# Patient Record
Sex: Male | Born: 1980 | Race: White | Hispanic: No | Marital: Single | Smoking: Former smoker
Health system: Southern US, Community
[De-identification: ages and names within clinical notes are randomized; demographics above are authoritative.]

## PROBLEM LIST (undated history)

## (undated) DIAGNOSIS — K219 Gastro-esophageal reflux disease without esophagitis: Secondary | ICD-10-CM

## (undated) DIAGNOSIS — K429 Umbilical hernia without obstruction or gangrene: Secondary | ICD-10-CM

## (undated) DIAGNOSIS — R011 Cardiac murmur, unspecified: Secondary | ICD-10-CM

## (undated) HISTORY — PX: NASAL SEPTUM SURGERY: SHX37

## (undated) HISTORY — PX: KNEE SURGERY: SHX244

## (undated) HISTORY — PX: SHOULDER ARTHROSCOPY: SHX128

---

## 2004-08-07 ENCOUNTER — Ambulatory Visit: Payer: Self-pay | Admitting: Otolaryngology

## 2004-12-20 ENCOUNTER — Emergency Department: Payer: Self-pay | Admitting: General Practice

## 2007-02-13 ENCOUNTER — Emergency Department: Payer: Self-pay | Admitting: Unknown Physician Specialty

## 2007-03-02 ENCOUNTER — Ambulatory Visit: Payer: Self-pay | Admitting: Sports Medicine

## 2007-03-10 ENCOUNTER — Ambulatory Visit: Payer: Self-pay | Admitting: Orthopaedic Surgery

## 2020-09-22 ENCOUNTER — Other Ambulatory Visit: Payer: Self-pay | Admitting: Otolaryngology

## 2020-09-22 DIAGNOSIS — R42 Dizziness and giddiness: Secondary | ICD-10-CM

## 2020-09-29 ENCOUNTER — Other Ambulatory Visit: Payer: Self-pay

## 2020-09-29 ENCOUNTER — Ambulatory Visit
Admission: RE | Admit: 2020-09-29 | Discharge: 2020-09-29 | Disposition: A | Discharge status: Home / Self Care | Payer: BC Managed Care – PPO | Source: Ambulatory Visit | Attending: Otolaryngology | Admitting: Otolaryngology

## 2020-09-29 DIAGNOSIS — R42 Dizziness and giddiness: Secondary | ICD-10-CM | POA: Insufficient documentation

## 2020-09-29 MED ORDER — GADOBUTROL 1 MMOL/ML IV SOLN
10.0000 mL | Freq: Once | INTRAVENOUS | Status: AC | PRN
  Administered 2020-09-29: 10 mL via INTRAVENOUS

## 2021-09-13 ENCOUNTER — Ambulatory Visit: Payer: Self-pay | Admitting: General Surgery

## 2021-09-18 ENCOUNTER — Ambulatory Visit: Payer: Self-pay | Admitting: General Surgery

## 2021-09-18 NOTE — H&P (View-Only) (Signed)
PATIENT PROFILE: ?Wayne Mcknight is a 41 y.o. male who presents to the Clinic for consultation at the request of Dr. Hazle Quant for evaluation of umbilical hernia. ? ?PCP:  Garry Heater, MD ? ?HISTORY OF PRESENT ILLNESS: ?Wayne Mcknight reports continued having pain in the periumbilical area.  Pain localized to the periumbilical area.  There is no palliation.  Pain is aggravated by coughing or straining.  There is no alleviating factors.  Patient cannot completely reduce the hernia.  Tender to palpation upon trying to reduce the hernia.  Denies any episode of abdominal distention nausea or vomiting. ? ? ?PROBLEM LIST: ?Problem List  Date Reviewed: 07/18/2021  ? ?       Noted  ? GERD (gastroesophageal reflux disease) Unknown  ? Anxiety Unknown  ? Insomnia Unknown  ? Overview  ?  Due to 3rd shift work ?  ?  ? ? ?GENERAL REVIEW OF SYSTEMS:  ? ?General ROS: negative for - chills, fatigue, fever, weight gain or weight loss ?Allergy and Immunology ROS: negative for - hives  ?Hematological and Lymphatic ROS: negative for - bleeding problems or bruising, negative for palpable nodes ?Endocrine ROS: negative for - heat or cold intolerance, hair changes ?Respiratory ROS: negative for - cough, shortness of breath or wheezing ?Cardiovascular ROS: no chest pain or palpitations ?GI ROS: negative for nausea, vomiting, positive for abdominal pain  ?Musculoskeletal ROS: negative for - joint swelling or muscle pain ?Neurological ROS: negative for - confusion, syncope ?Dermatological ROS: negative for pruritus and rash ?Psychiatric: negative for anxiety, depression, difficulty sleeping and memory loss ? ?MEDICATIONS: ?Current Outpatient Medications  ?Medication Sig Dispense Refill  ? azelastine (ASTELIN) 137 mcg nasal spray PLACE 1-2 SPRAYS INTO EACH NOSTRIL EVERY 12 HOURS AS NEEDED FOR DRAINAGE    ? cetirizine (ZYRTEC) 10 MG tablet Take 10 mg by mouth once daily as needed    ? esomeprazole (NEXIUM) 20 MG DR capsule Take 20  mg by mouth once daily.    ? famotidine (PEPCID) 10 MG tablet Take 10 mg by mouth once daily    ? ?No current facility-administered medications for this visit.  ? ? ?ALLERGIES: ?Amoxicillin (bulk) ? ?PAST MEDICAL HISTORY: ?Past Medical History:  ?Diagnosis Date  ? Allergic rhinitis   ? Anxiety   ? Arrhythmia Functional heart murmor diagnosed at birth  ? GERD (gastroesophageal reflux disease)   ? Heart murmur, unspecified Diagnosed at birth  ? Insomnia   ? Due to 3rd shift work  ? ? ?PAST SURGICAL HISTORY: ?Past Surgical History:  ?Procedure Laterality Date  ? KNEE ARTHROSCOPY  2000  ? SHOULDER ARTHROSCOPY DISTAL CLAVICLE EXCISION AND OPEN ROTATOR CUFF REPAIR Right 2008  ? Labral repair, biceps tendonosis - [facility]  ? TONSILLECTOMY    ?  ? ?FAMILY HISTORY: ?Family History  ?Problem Relation Age of Onset  ? Stroke Paternal Grandmother   ? Stroke Paternal Grandfather   ?  ? ?SOCIAL HISTORY: ?Social History  ? ?Socioeconomic History  ? Marital status: Single  ?  Spouse name: Reuel Boom  ? Number of children: 1  ?Occupational History  ? Occupation: Pensions consultant  ?  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 Smoking status: Former  ?  Packs/day: 0.00  ?  Years: 0.00  ?  Pack years: 0.00  ?  Types: Cigarettes  ? Smokeless tobacco: Former  ?Substance and Sexual Activity  ? Alcohol use: Yes  ?  Alcohol/week: 0.0 standard drinks  ?  Comment: Few drinks/wk  ? Drug  use: No  ? Sexual activity: Yes  ?  Partners: Female  ? ? ?PHYSICAL EXAM: ?Vitals:  ? 09/18/21 0825  ?BP: 139/62  ?Pulse: 79  ? ?Body mass index is 31.66 kg/m?. ?Weight: (!) 108.9 kg (240 lb)  ? ?GENERAL: Alert, active, oriented x3 ? ?HEENT: Pupils equal reactive to light. Extraocular movements are intact. Sclera clear. Palpebral conjunctiva normal red color.Pharynx clear. ? ?NECK: Supple with no palpable mass and no adenopathy. ? ?LUNGS: Sound clear with no rales rhonchi or wheezes. ? ?HEART: Regular rhythm S1 and S2 without murmur. ? ?ABDOMEN: Soft and depressible,  nontender with no palpable mass, no hepatomegaly.  Palpable umbilical hernia, tender to palpation, no skin changes.  Unable to completely reduce. ? ?EXTREMITIES: Well-developed well-nourished symmetrical with no dependent edema. ? ?NEUROLOGICAL: Awake alert oriented, facial expression symmetrical, moving all extremities. ? ?REVIEW OF DATA: ?I have reviewed the following data today: ?No visits with results within 3 Month(s) from this visit.  ?Latest known visit with results is:  ?Nurse Only on 01/17/2020  ?Component Date Value  ? Coronavirus (COVID-19) S* 01/17/2020 Not Detected   ?  ? ?ASSESSMENT: ?Wayne Mcknight is a 41 y.o. male presenting for reevaluation for umbilical hernia repair.   ? ?The patient continue with a symptomatic umbilical incarcerated hernia. Patient was oriented about the diagnosis of umbilical hernia and its implication. The patient was oriented about the treatment alternatives (observation vs surgical repair). Due to patient symptoms, repair is recommended. Patient oriented about the surgical procedure, the use of mesh and its risk of complications such as: infection, bleeding, injury to vasculature, injury to bowel or bladder, and chronic pain, intestinal obstruction, among others.  ? ?Umbilical hernia without obstruction and without gangrene [K42.9] ? ?PLAN: ?1.  Robotic assisted laparoscopic umbilical hernia repair with mesh (47340) ?2.  CBC, CMP ?3.  Avoid taking aspirin 5 days before procedure ?4.  Contact us if has any question or concern.  ? ?Patient verbalized understanding, all questions were answered, and were agreeable with the plan outlined above.  ? ? ?Carolan Shiver, MD ? ?Electronically signed by Carolan Shiver, MD ? ?

## 2021-09-18 NOTE — H&P (Signed)
PATIENT PROFILE: ?Wayne Mcknight is a 41 y.o. male who presents to the Clinic for consultation at the request of Dr. Hazle Quant for evaluation of umbilical hernia. ? ?PCP:  Garry Heater, MD ? ?HISTORY OF PRESENT ILLNESS: ?Wayne Mcknight reports continued having pain in the periumbilical area.  Pain localized to the periumbilical area.  There is no palliation.  Pain is aggravated by coughing or straining.  There is no alleviating factors.  Patient cannot completely reduce the hernia.  Tender to palpation upon trying to reduce the hernia.  Denies any episode of abdominal distention nausea or vomiting. ? ? ?PROBLEM LIST: ?Problem List  Date Reviewed: 07/18/2021  ? ?       Noted  ? GERD (gastroesophageal reflux disease) Unknown  ? Anxiety Unknown  ? Insomnia Unknown  ? Overview  ?  Due to 3rd shift work ?  ?  ? ? ?GENERAL REVIEW OF SYSTEMS:  ? ?General ROS: negative for - chills, fatigue, fever, weight gain or weight loss ?Allergy and Immunology ROS: negative for - hives  ?Hematological and Lymphatic ROS: negative for - bleeding problems or bruising, negative for palpable nodes ?Endocrine ROS: negative for - heat or cold intolerance, hair changes ?Respiratory ROS: negative for - cough, shortness of breath or wheezing ?Cardiovascular ROS: no chest pain or palpitations ?GI ROS: negative for nausea, vomiting, positive for abdominal pain  ?Musculoskeletal ROS: negative for - joint swelling or muscle pain ?Neurological ROS: negative for - confusion, syncope ?Dermatological ROS: negative for pruritus and rash ?Psychiatric: negative for anxiety, depression, difficulty sleeping and memory loss ? ?MEDICATIONS: ?Current Outpatient Medications  ?Medication Sig Dispense Refill  ? azelastine (ASTELIN) 137 mcg nasal spray PLACE 1-2 SPRAYS INTO EACH NOSTRIL EVERY 12 HOURS AS NEEDED FOR DRAINAGE    ? cetirizine (ZYRTEC) 10 MG tablet Take 10 mg by mouth once daily as needed    ? esomeprazole (NEXIUM) 20 MG DR capsule Take 20  mg by mouth once daily.    ? famotidine (PEPCID) 10 MG tablet Take 10 mg by mouth once daily    ? ?No current facility-administered medications for this visit.  ? ? ?ALLERGIES: ?Amoxicillin (bulk) ? ?PAST MEDICAL HISTORY: ?Past Medical History:  ?Diagnosis Date  ? Allergic rhinitis   ? Anxiety   ? Arrhythmia Functional heart murmor diagnosed at birth  ? GERD (gastroesophageal reflux disease)   ? Heart murmur, unspecified Diagnosed at birth  ? Insomnia   ? Due to 3rd shift work  ? ? ?PAST SURGICAL HISTORY: ?Past Surgical History:  ?Procedure Laterality Date  ? KNEE ARTHROSCOPY  2000  ? SHOULDER ARTHROSCOPY DISTAL CLAVICLE EXCISION AND OPEN ROTATOR CUFF REPAIR Right 2008  ? Labral repair, biceps tendonosis - [facility]  ? TONSILLECTOMY    ?  ? ?FAMILY HISTORY: ?Family History  ?Problem Relation Age of Onset  ? Stroke Paternal Grandmother   ? Stroke Paternal Grandfather   ?  ? ?SOCIAL HISTORY: ?Social History  ? ?Socioeconomic History  ? Marital status: Single  ?  Spouse name: Reuel Boom  ? Number of children: 1  ?Occupational History  ? Occupation: Pensions consultant  ?  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 Smoking status: Former  ?  Packs/day: 0.00  ?  Years: 0.00  ?  Pack years: 0.00  ?  Types: Cigarettes  ? Smokeless tobacco: Former  ?Substance and Sexual Activity  ? Alcohol use: Yes  ?  Alcohol/week: 0.0 standard drinks  ?  Comment: Few drinks/wk  ? Drug  use: No  ? Sexual activity: Yes  ?  Partners: Female  ? ? ?PHYSICAL EXAM: ?Vitals:  ? 09/18/21 0825  ?BP: 139/62  ?Pulse: 79  ? ?Body mass index is 31.66 kg/m?. ?Weight: (!) 108.9 kg (240 lb)  ? ?GENERAL: Alert, active, oriented x3 ? ?HEENT: Pupils equal reactive to light. Extraocular movements are intact. Sclera clear. Palpebral conjunctiva normal red color.Pharynx clear. ? ?NECK: Supple with no palpable mass and no adenopathy. ? ?LUNGS: Sound clear with no rales rhonchi or wheezes. ? ?HEART: Regular rhythm S1 and S2 without murmur. ? ?ABDOMEN: Soft and depressible,  nontender with no palpable mass, no hepatomegaly.  Palpable umbilical hernia, tender to palpation, no skin changes.  Unable to completely reduce. ? ?EXTREMITIES: Well-developed well-nourished symmetrical with no dependent edema. ? ?NEUROLOGICAL: Awake alert oriented, facial expression symmetrical, moving all extremities. ? ?REVIEW OF DATA: ?I have reviewed the following data today: ?No visits with results within 3 Month(s) from this visit.  ?Latest known visit with results is:  ?Nurse Only on 01/17/2020  ?Component Date Value  ? Coronavirus (COVID-19) S* 01/17/2020 Not Detected   ?  ? ?ASSESSMENT: ?Wayne Mcknight is a 41 y.o. male presenting for reevaluation for umbilical hernia repair.   ? ?The patient continue with a symptomatic umbilical incarcerated hernia. Patient was oriented about the diagnosis of umbilical hernia and its implication. The patient was oriented about the treatment alternatives (observation vs surgical repair). Due to patient symptoms, repair is recommended. Patient oriented about the surgical procedure, the use of mesh and its risk of complications such as: infection, bleeding, injury to vasculature, injury to bowel or bladder, and chronic pain, intestinal obstruction, among others.  ? ?Umbilical hernia without obstruction and without gangrene [K42.9] ? ?PLAN: ?1.  Robotic assisted laparoscopic umbilical hernia repair with mesh (47340) ?2.  CBC, CMP ?3.  Avoid taking aspirin 5 days before procedure ?4.  Contact us if has any question or concern.  ? ?Patient verbalized understanding, all questions were answered, and were agreeable with the plan outlined above.  ? ? ?Carolan Shiver, MD ? ?Electronically signed by Carolan Shiver, MD ? ?

## 2021-09-20 ENCOUNTER — Ambulatory Visit: Payer: Self-pay | Admitting: General Surgery

## 2021-09-24 ENCOUNTER — Encounter
Admission: RE | Admit: 2021-09-24 | Discharge: 2021-09-24 | Disposition: A | Discharge status: Home / Self Care | Payer: BC Managed Care – PPO | Source: Ambulatory Visit | Attending: General Surgery | Admitting: General Surgery

## 2021-09-24 HISTORY — DX: Umbilical hernia without obstruction or gangrene: K42.9

## 2021-09-24 HISTORY — DX: Cardiac murmur, unspecified: R01.1

## 2021-09-24 HISTORY — DX: Gastro-esophageal reflux disease without esophagitis: K21.9

## 2021-09-24 NOTE — Patient Instructions (Signed)
Your procedure is scheduled on:10-01-21 Monday ?Report to the Registration Desk on the 1st floor of the [facility].Then proceed to the 2nd floor Surgery Desk ?To find out your arrival time, please call [phone] between 1PM - 3PM on:09-28-21 Friday ?If your arrival time is 6:00 am, do not arrive prior to that time as the [facility] entrance doors do not open until 6:00 am. ? ?REMEMBER: ?Instructions that are not followed completely may result in serious medical risk, up to and including death; or upon the discretion of your surgeon and anesthesiologist your surgery may need to be rescheduled. ? ?Do not eat food OR drink any liquids after midnight the night before surgery.  ?No gum chewing, lozengers or hard candies.. ? ?TAKE THESE MEDICATIONS THE MORNING OF SURGERY WITH A SIP OF WATER: ?-esomeprazole (NEXIUM)- take one the night before and one on the morning of surgery - helps to prevent nausea after surgery.) ? ?One week prior to surgery: ?Stop Anti-inflammatories (NSAIDS) such as Advil, Aleve, Ibuprofen, Motrin, Naproxen, Naprosyn and Aspirin based products such as Excedrin, Goodys Powder, BC Powder.You may however, take Tylenol if needed for pain up until the day of surgery. ?Stop ANY OVER THE COUNTER supplements until after surgery (Vitamin C, Collagen and Protein Powder) ? ?No Alcohol for 24 hours before or after surgery. ? ?No Smoking including e-cigarettes for 24 hours prior to surgery.  ?No chewable tobacco products for at least 6 hours prior to surgery.  ?No nicotine patches on the day of surgery. ? ?Do not use any "recreational" drugs for at least a week prior to your surgery.  ?Please be advised that the combination of cocaine and anesthesia may have negative outcomes, up to and including death. ?If you test positive for cocaine, your surgery will be cancelled. ? ?On the morning of surgery brush your teeth with toothpaste and water, you may rinse your mouth with mouthwash if you wish. ?Do not swallow  any toothpaste or mouthwash. ? ?Use CHG Soap as directed on instruction sheet. ? ?Do not wear jewelry, make-up, hairpins, clips or nail polish. ? ?Do not wear lotions, powders, or perfumes.  ? ?Do not shave body from the neck down 48 hours prior to surgery just in case you cut yourself which could leave a site for infection.  ?Also, freshly shaved skin may become irritated if using the CHG soap. ? ?Contact lenses, hearing aids and dentures may not be worn into surgery. ? ?Do not bring valuables to the hospital. [facility] is not responsible for any missing/lost belongings or valuables. ? ?Notify your doctor if there is any change in your medical condition (cold, fever, infection). ? ?Wear comfortable clothing (specific to your surgery type) to the hospital. ? ?After surgery, you can help prevent lung complications by doing breathing exercises.  ?Take deep breaths and cough every 1-2 hours. Your doctor may order a device called an Incentive Spirometer to help you take deep breaths. ?When coughing or sneezing, hold a pillow firmly against your incision with both hands. This is called ?splinting.? Doing this helps protect your incision. It also decreases belly discomfort. ? ?If you are being admitted to the hospital overnight, leave your suitcase in the car. ?After surgery it may be brought to your room. ? ?If you are being discharged the day of surgery, you will not be allowed to drive home. ?You will need a responsible adult (18 years or older) to drive you home and stay with you that night.  ? ?If  you are taking public transportation, you will need to have a responsible adult (18 years or older) with you. ?Please confirm with your physician that it is acceptable to use public transportation.  ? ?Please call the Pre-admissions Testing Dept. at [phone] if you have any questions about these instructions. ? ?Surgery Visitation Policy: ? ?Patients undergoing a surgery or procedure may have two family members or  support persons with them as long as the person is not COVID-19 positive or experiencing its symptoms.  ?

## 2021-09-28 ENCOUNTER — Inpatient Hospital Stay: Admission: RE | Admit: 2021-09-28 | Payer: BC Managed Care – PPO | Source: Ambulatory Visit

## 2021-10-01 ENCOUNTER — Other Ambulatory Visit: Payer: Self-pay

## 2021-10-01 ENCOUNTER — Encounter
Admission: RE | Disposition: A | Discharge status: Home / Self Care | Payer: Self-pay | Source: Home / Self Care | Attending: General Surgery

## 2021-10-01 ENCOUNTER — Ambulatory Visit: Payer: BC Managed Care – PPO | Admitting: Anesthesiology

## 2021-10-01 ENCOUNTER — Ambulatory Visit
Admission: RE | Admit: 2021-10-01 | Discharge: 2021-10-01 | Disposition: A | Discharge status: Home / Self Care | Payer: BC Managed Care – PPO | Attending: General Surgery | Admitting: General Surgery

## 2021-10-01 DIAGNOSIS — K429 Umbilical hernia without obstruction or gangrene: Secondary | ICD-10-CM | POA: Diagnosis present

## 2021-10-01 DIAGNOSIS — K420 Umbilical hernia with obstruction, without gangrene: Secondary | ICD-10-CM | POA: Diagnosis not present

## 2021-10-01 HISTORY — PX: INSERTION OF MESH: SHX5868

## 2021-10-01 SURGERY — REPAIR, HERNIA, UMBILICAL, ROBOT-ASSISTED
Anesthesia: General | Site: Abdomen

## 2021-10-01 MED ORDER — SUCCINYLCHOLINE CHLORIDE 200 MG/10ML IV SOSY
PREFILLED_SYRINGE | INTRAVENOUS | Status: DC | PRN
  Administered 2021-10-01: 140 mg via INTRAVENOUS

## 2021-10-01 MED ORDER — DEXAMETHASONE SODIUM PHOSPHATE 10 MG/ML IJ SOLN
INTRAMUSCULAR | Status: DC | PRN
Start: 2021-10-01 — End: 2021-10-01
  Administered 2021-10-01: 10 mg via INTRAVENOUS

## 2021-10-01 MED ORDER — MIDAZOLAM HCL 2 MG/2ML IJ SOLN
INTRAMUSCULAR | Status: AC
  Filled 2021-10-01: qty 2

## 2021-10-01 MED ORDER — PROPOFOL 10 MG/ML IV BOLUS
INTRAVENOUS | Status: AC
  Filled 2021-10-01: qty 20

## 2021-10-01 MED ORDER — ACETAMINOPHEN 10 MG/ML IV SOLN
INTRAVENOUS | Status: DC | PRN
  Administered 2021-10-01: 1000 mg via INTRAVENOUS

## 2021-10-01 MED ORDER — FENTANYL CITRATE (PF) 100 MCG/2ML IJ SOLN
INTRAMUSCULAR | Status: DC | PRN
  Administered 2021-10-01 (×2): 50 ug via INTRAVENOUS

## 2021-10-01 MED ORDER — PHENYLEPHRINE HCL (PRESSORS) 10 MG/ML IV SOLN
INTRAVENOUS | Status: DC | PRN
  Administered 2021-10-01 (×3): 80 ug via INTRAVENOUS

## 2021-10-01 MED ORDER — OXYCODONE-ACETAMINOPHEN 5-325 MG PO TABS: 1.0000 | ORAL_TABLET | ORAL | 0 refills | Status: AC | PRN

## 2021-10-01 MED ORDER — ONDANSETRON HCL 4 MG/2ML IJ SOLN: 4.0000 mg | Freq: Once | INTRAMUSCULAR | Status: DC | PRN

## 2021-10-01 MED ORDER — KETOROLAC TROMETHAMINE 30 MG/ML IJ SOLN
INTRAMUSCULAR | Status: DC | PRN
  Administered 2021-10-01: 15 mg via INTRAVENOUS

## 2021-10-01 MED ORDER — ONDANSETRON HCL 4 MG/2ML IJ SOLN
INTRAMUSCULAR | Status: DC | PRN
  Administered 2021-10-01: 4 mg via INTRAVENOUS

## 2021-10-01 MED ORDER — SEVOFLURANE IN SOLN
RESPIRATORY_TRACT | Status: AC
  Filled 2021-10-01: qty 250

## 2021-10-01 MED ORDER — CHLORHEXIDINE GLUCONATE 0.12 % MT SOLN
OROMUCOSAL | Status: AC
  Administered 2021-10-01: 15 mL via OROMUCOSAL
  Filled 2021-10-01: qty 15

## 2021-10-01 MED ORDER — ORAL CARE MOUTH RINSE
15.0000 mL | Freq: Once | OROMUCOSAL | Status: AC
Start: 2021-10-01 — End: 2021-10-01

## 2021-10-01 MED ORDER — KETAMINE HCL 10 MG/ML IJ SOLN
INTRAMUSCULAR | Status: DC | PRN
  Administered 2021-10-01 (×2): 20 mg via INTRAVENOUS

## 2021-10-01 MED ORDER — FENTANYL CITRATE (PF) 100 MCG/2ML IJ SOLN
INTRAMUSCULAR | Status: AC
  Filled 2021-10-01: qty 2

## 2021-10-01 MED ORDER — DEXMEDETOMIDINE HCL IN NACL 200 MCG/50ML IV SOLN
INTRAVENOUS | Status: DC | PRN
  Administered 2021-10-01: 12 ug via INTRAVENOUS
  Administered 2021-10-01: 8 ug via INTRAVENOUS

## 2021-10-01 MED ORDER — CEFAZOLIN SODIUM-DEXTROSE 2-4 GM/100ML-% IV SOLN
2.0000 g | INTRAVENOUS | Status: AC
  Administered 2021-10-01: 2 g via INTRAVENOUS

## 2021-10-01 MED ORDER — GLYCOPYRROLATE 0.2 MG/ML IJ SOLN
INTRAMUSCULAR | Status: DC | PRN
  Administered 2021-10-01: .2 mg via INTRAVENOUS

## 2021-10-01 MED ORDER — FENTANYL CITRATE (PF) 100 MCG/2ML IJ SOLN
INTRAMUSCULAR | Status: AC
  Administered 2021-10-01: 25 ug via INTRAVENOUS
  Filled 2021-10-01: qty 2

## 2021-10-01 MED ORDER — BUPIVACAINE-EPINEPHRINE (PF) 0.25% -1:200000 IJ SOLN
INTRAMUSCULAR | Status: DC | PRN
  Administered 2021-10-01: 30 mL

## 2021-10-01 MED ORDER — LACTATED RINGERS IV SOLN: INTRAVENOUS | Status: DC

## 2021-10-01 MED ORDER — KETAMINE HCL 50 MG/5ML IJ SOSY
PREFILLED_SYRINGE | INTRAMUSCULAR | Status: AC
  Filled 2021-10-01: qty 5

## 2021-10-01 MED ORDER — LIDOCAINE HCL (CARDIAC) PF 100 MG/5ML IV SOSY
PREFILLED_SYRINGE | INTRAVENOUS | Status: DC | PRN
Start: 2021-10-01 — End: 2021-10-01
  Administered 2021-10-01: 100 mg via INTRAVENOUS

## 2021-10-01 MED ORDER — MIDAZOLAM HCL 2 MG/2ML IJ SOLN
INTRAMUSCULAR | Status: DC | PRN
  Administered 2021-10-01: 2 mg via INTRAVENOUS

## 2021-10-01 MED ORDER — BUPIVACAINE-EPINEPHRINE (PF) 0.25% -1:200000 IJ SOLN
INTRAMUSCULAR | Status: AC
  Filled 2021-10-01: qty 30

## 2021-10-01 MED ORDER — ONDANSETRON HCL 4 MG/2ML IJ SOLN
INTRAMUSCULAR | Status: AC
  Filled 2021-10-01: qty 2

## 2021-10-01 MED ORDER — DEXAMETHASONE SODIUM PHOSPHATE 10 MG/ML IJ SOLN
INTRAMUSCULAR | Status: AC
  Filled 2021-10-01: qty 1

## 2021-10-01 MED ORDER — SUGAMMADEX SODIUM 200 MG/2ML IV SOLN
INTRAVENOUS | Status: DC | PRN
  Administered 2021-10-01: 200 mg via INTRAVENOUS

## 2021-10-01 MED ORDER — EPHEDRINE SULFATE (PRESSORS) 50 MG/ML IJ SOLN
INTRAMUSCULAR | Status: DC | PRN
  Administered 2021-10-01 (×5): 5 mg via INTRAVENOUS

## 2021-10-01 MED ORDER — CHLORHEXIDINE GLUCONATE 0.12 % MT SOLN: 15.0000 mL | Freq: Once | OROMUCOSAL | Status: AC

## 2021-10-01 MED ORDER — FENTANYL CITRATE (PF) 100 MCG/2ML IJ SOLN
25.0000 ug | INTRAMUSCULAR | Status: DC | PRN
  Administered 2021-10-01 (×3): 25 ug via INTRAVENOUS

## 2021-10-01 MED ORDER — CEFAZOLIN SODIUM-DEXTROSE 2-4 GM/100ML-% IV SOLN
INTRAVENOUS | Status: AC
  Filled 2021-10-01: qty 100

## 2021-10-01 MED ORDER — ROCURONIUM BROMIDE 100 MG/10ML IV SOLN
INTRAVENOUS | Status: DC | PRN
Start: 2021-10-01 — End: 2021-10-01
  Administered 2021-10-01: 20 mg via INTRAVENOUS
  Administered 2021-10-01: 10 mg via INTRAVENOUS
  Administered 2021-10-01 (×2): 50 mg via INTRAVENOUS

## 2021-10-01 MED ORDER — PROPOFOL 10 MG/ML IV BOLUS
INTRAVENOUS | Status: DC | PRN
  Administered 2021-10-01: 300 mg via INTRAVENOUS

## 2021-10-01 SURGICAL SUPPLY — 49 items
ADH SKN CLS APL DERMABOND .7 (GAUZE/BANDAGES/DRESSINGS) ×2
BAG INFUSER PRESSURE 100CC (MISCELLANEOUS) IMPLANT
BLADE SURG SZ11 CARB STEEL (BLADE) ×3 IMPLANT
COVER TIP SHEARS 8 DVNC (MISCELLANEOUS) ×2 IMPLANT
COVER TIP SHEARS 8MM DA VINCI (MISCELLANEOUS) ×1
DERMABOND ADVANCED (GAUZE/BANDAGES/DRESSINGS) ×1
DERMABOND ADVANCED .7 DNX12 (GAUZE/BANDAGES/DRESSINGS) ×2 IMPLANT
DRAPE ARM DVNC X/XI (DISPOSABLE) ×6 IMPLANT
DRAPE COLUMN DVNC XI (DISPOSABLE) ×2 IMPLANT
DRAPE DA VINCI XI ARM (DISPOSABLE) ×3
DRAPE DA VINCI XI COLUMN (DISPOSABLE) ×1
ELECT REM PT RETURN 9FT ADLT (ELECTROSURGICAL) ×3
ELECTRODE REM PT RTRN 9FT ADLT (ELECTROSURGICAL) ×2 IMPLANT
GLOVE BIO SURGEON STRL SZ 6.5 (GLOVE) ×7 IMPLANT
GLOVE BIOGEL PI IND STRL 6.5 (GLOVE) ×4 IMPLANT
GLOVE BIOGEL PI INDICATOR 6.5 (GLOVE) ×3
GOWN STRL REUS W/ TWL LRG LVL3 (GOWN DISPOSABLE) ×6 IMPLANT
GOWN STRL REUS W/TWL LRG LVL3 (GOWN DISPOSABLE) ×9
IRRIGATOR SUCT 8 DISP DVNC XI (IRRIGATION / IRRIGATOR) IMPLANT
IRRIGATOR SUCTION 8MM XI DISP (IRRIGATION / IRRIGATOR)
IV CATH ANGIO 12GX3 LT BLUE (NEEDLE) IMPLANT
IV NS 1000ML (IV SOLUTION)
IV NS 1000ML BAXH (IV SOLUTION) IMPLANT
KIT PINK PAD W/HEAD ARE REST (MISCELLANEOUS) ×3
KIT PINK PAD W/HEAD ARM REST (MISCELLANEOUS) ×2 IMPLANT
LABEL OR SOLS (LABEL) ×3 IMPLANT
MANIFOLD NEPTUNE II (INSTRUMENTS) ×3 IMPLANT
MESH PROGRIP HERNIA FLAT 15X15 (Mesh General) ×1 IMPLANT
NDL INSUFFLATION 14GA 120MM (NEEDLE) ×2 IMPLANT
NEEDLE HYPO 22GX1.5 SAFETY (NEEDLE) ×3 IMPLANT
NEEDLE INSUFFLATION 14GA 120MM (NEEDLE) ×3 IMPLANT
NS IRRIG 500ML POUR BTL (IV SOLUTION) ×3 IMPLANT
OBTURATOR OPTICAL STANDARD 8MM (TROCAR) ×1
OBTURATOR OPTICAL STND 8 DVNC (TROCAR) ×2
OBTURATOR OPTICALSTD 8 DVNC (TROCAR) ×2 IMPLANT
PACK LAP CHOLECYSTECTOMY (MISCELLANEOUS) ×3 IMPLANT
SEAL CANN UNIV 5-8 DVNC XI (MISCELLANEOUS) ×6 IMPLANT
SEAL XI 5MM-8MM UNIVERSAL (MISCELLANEOUS) ×3
SET TUBE SMOKE EVAC HIGH FLOW (TUBING) ×3 IMPLANT
SOLUTION ELECTROLUBE (MISCELLANEOUS) ×3 IMPLANT
SUT MNCRL 4-0 (SUTURE) ×6
SUT MNCRL 4-0 27XMFL (SUTURE) ×4
SUT STRATAFIX PDS 30 CT-1 (SUTURE) ×3 IMPLANT
SUT VIC AB 3-0 SH 27 (SUTURE) ×3
SUT VIC AB 3-0 SH 27X BRD (SUTURE) IMPLANT
SUT VICRYL 0 AB UR-6 (SUTURE) ×3 IMPLANT
SUT VLOC 90 2/L VL 12 GS22 (SUTURE) ×5 IMPLANT
SUTURE MNCRL 4-0 27XMF (SUTURE) ×2 IMPLANT
WATER STERILE IRR 500ML POUR (IV SOLUTION) ×2 IMPLANT

## 2021-10-01 NOTE — Anesthesia Postprocedure Evaluation (Signed)
Anesthesia Post Note ? ?Patient: Wayne Mcknight ? ?Procedure(s) Performed: XI ROBOT ASSISTED UMBILICAL HERNIA REPAIR (Abdomen) ?INSERTION OF MESH ? ?Patient location during evaluation: PACU ?Anesthesia Type: General ?Level of consciousness: awake and alert ?Pain management: pain level controlled ?Vital Signs Assessment: post-procedure vital signs reviewed and stable ?Respiratory status: spontaneous breathing, nonlabored ventilation, respiratory function stable and patient connected to nasal cannula oxygen ?Cardiovascular status: blood pressure returned to baseline and stable ?Postop Assessment: no apparent nausea or vomiting ?Anesthetic complications: no ? ? ?No notable events documented. ? ? ?Last Vitals:  ?Vitals:  ? 10/01/21 0939 10/01/21 0945  ?BP: 123/67 126/61  ?Pulse: 86 72  ?Resp: 14 14  ?Temp: 37.2 ?C   ?SpO2: 100% 99%  ?  ?Last Pain:  ?Vitals:  ? 10/01/21 0945  ?TempSrc:   ?PainSc: Asleep  ? ? ?  ?  ?  ?  ?  ?  ? ?Yevette Edwards ? ? ? ? ?

## 2021-10-01 NOTE — Anesthesia Preprocedure Evaluation (Signed)
Anesthesia Evaluation  ?Patient identified by MRN, date of birth, ID band ?Patient awake ? ? ? ?Reviewed: ?Allergy & Precautions, H&P , NPO status , Patient's Chart, lab work & pertinent test results, reviewed documented beta blocker date and time  ? ?Airway ?Mallampati: III ? ?TM Distance: >3 FB ?Neck ROM: full ? ?Mouth opening: Limited Mouth Opening ? Dental ? ?(+) Teeth Intact ?  ?Pulmonary ?neg pulmonary ROS, former smoker,  ?  ?Pulmonary exam normal ? ? ? ? ? ? ? Cardiovascular ?Exercise Tolerance: Good ?Normal cardiovascular exam+ Valvular Problems/Murmurs  ?Rhythm:regular Rate:Normal ? ? ?  ?Neuro/Psych ?negative neurological ROS ? negative psych ROS  ? GI/Hepatic ?Neg liver ROS, GERD  Medicated,  ?Endo/Other  ?negative endocrine ROS ? Renal/GU ?negative Renal ROS  ?negative genitourinary ?  ?Musculoskeletal ? ? Abdominal ?  ?Peds ? Hematology ?negative hematology ROS ?(+)   ?Anesthesia Other Findings ?Past Medical History: ?No date: GERD (gastroesophageal reflux disease) ?No date: Heart murmur ?No date: Umbilical hernia ?Past Surgical History: ?No date: KNEE SURGERY; Right ?No date: NASAL SEPTUM SURGERY ?No date: SHOULDER ARTHROSCOPY; Right ?BMI   ? Body Mass Index: 30.34 kg/m?  ?  ? Reproductive/Obstetrics ?negative OB ROS ? ?  ? ? ? ? ? ? ? ? ? ? ? ? ? ?  ?  ? ? ? ? ? ? ? ? ?Anesthesia Physical ?Anesthesia Plan ? ?ASA: 2 ? ?Anesthesia Plan: General ETT  ? ?Post-op Pain Management:   ? ?Induction:  ? ?PONV Risk Score and Plan:  ? ?Airway Management Planned:  ? ?Additional Equipment:  ? ?Intra-op Plan:  ? ?Post-operative Plan:  ? ?Informed Consent: I have reviewed the patients History and Physical, chart, labs and discussed the procedure including the risks, benefits and alternatives for the proposed anesthesia with the patient or authorized representative who has indicated his/her understanding and acceptance.  ? ? ? ?Dental Advisory Given ? ?Plan Discussed with:  CRNA ? ?Anesthesia Plan Comments:   ? ? ? ? ? ? ?Anesthesia Quick Evaluation ? ?

## 2021-10-01 NOTE — Interval H&P Note (Signed)
History and Physical Interval Note: ? ?10/01/2021 ?6:48 AM ? ?Wayne Mcknight  has presented today for surgery, with the diagnosis of [id number] umbilical hernia w/o obstruction or gangrene.  The various methods of treatment have been discussed with the patient and family. After consideration of risks, benefits and other options for treatment, the patient has consented to  Procedure(s): ?XI [place] (N/A) as a surgical intervention.  The patient's history has been reviewed, patient examined, no change in status, stable for surgery.  I have reviewed the patient's chart and labs.  Questions were answered to the patient's satisfaction.   ? ? ?[name] ? ? ?

## 2021-10-01 NOTE — Transfer of Care (Addendum)
Immediate Anesthesia Transfer of Care Note ? ?Patient: Wayne Mcknight ? ?Procedure(s) Performed: XI ROBOT ASSISTED UMBILICAL HERNIA REPAIR (Abdomen) ? ?Patient Location: PACU ? ?Anesthesia Type:General ? ?Level of Consciousness: awake ? ?Airway & Oxygen Therapy: Patient Spontanous Breathing ? ?Post-op Assessment: Report given to RN ? ?Post vital signs: stable ? ?Last Vitals:  ?Vitals Value Taken Time  ?BP    ?Temp    ?Pulse    ?Resp    ?SpO2    ? ? ?Last Pain:  ?Vitals:  ? 10/01/21 0624  ?TempSrc: Oral  ?PainSc: 0-No pain  ?   ? ?  ? ?Complications: No notable events documented. ?

## 2021-10-01 NOTE — Discharge Instructions (Addendum)
?  Diet: Resume home heart healthy regular diet.  ? ?Activity: No heavy lifting >20 pounds (children, pets, laundry, garbage) or strenuous activity until follow-up, but light activity and walking are encouraged. Do not drive or drink alcohol if taking narcotic pain medications. ? ?Wound care: May shower with soapy water and pat dry (do not rub incisions), but no baths or submerging incision underwater until follow-up. (no swimming)  ? ?Medications: Resume all home medications. For mild to moderate pain: acetaminophen (Tylenol) or ibuprofen (if no kidney disease). Combining Tylenol with alcohol can substantially increase your risk of causing liver disease. Narcotic pain medications, if prescribed, can be used for severe pain, though may cause nausea, constipation, and drowsiness.  If you do not need the narcotic pain medication, you do not need to fill the prescription. ? ?Call office [phone]) at any time if any questions, worsening pain, fevers/chills, bleeding, drainage from incision site, or other concerns. ? ?AMBULATORY SURGERY  ?DISCHARGE INSTRUCTIONS ? ? ?The drugs that you were given will stay in your system until tomorrow so for the next 24 hours you should not: ? ?Drive an automobile ?Make any legal decisions ?Drink any alcoholic beverage ? ? ?You may resume regular meals tomorrow.  Today it is better to start with liquids and gradually work up to solid foods. ? ?You may eat anything you prefer, but it is better to start with liquids, then soup and crackers, and gradually work up to solid foods. ? ? ?Please notify your doctor immediately if you have any unusual bleeding, trouble breathing, redness and pain at the surgery site, drainage, fever, or pain not relieved by medication. ? ? ? ?Additional Instructions:  NEXT DOES MOTRIN CAN BE AT 530 pm ? ? ? ? ? ? ? ?Please contact your physician with any problems or Same Day Surgery at [phone], Monday through Friday 6 am to 4 pm, or [place] at  [facility] number at [phone].  ? ?

## 2021-10-01 NOTE — Anesthesia Procedure Notes (Signed)
Procedure Name: Intubation ?Date/Time: 10/01/2021 7:40 AM ?Performed by: Carter Kitten, CRNA ?Pre-anesthesia Checklist: Patient identified, Patient being monitored, Timeout performed, Emergency Drugs available and Suction available ?Patient Re-evaluated:Patient Re-evaluated prior to induction ?Oxygen Delivery Method: Circle system utilized ?Preoxygenation: Pre-oxygenation with 100% oxygen ?Induction Type: IV induction ?Ventilation: Mask ventilation without difficulty ?Laryngoscope Size: 3 and McGraph ?Grade View: Grade I ?Tube type: Oral ?Tube size: 7.0 mm ?Number of attempts: 1 ?Airway Equipment and Method: Stylet ?Placement Confirmation: ETT inserted through vocal cords under direct vision, positive ETCO2 and breath sounds checked- equal and bilateral ?Secured at: 22 cm ?Tube secured with: Tape ?Dental Injury: Teeth and Oropharynx as per pre-operative assessment  ? ? ? ? ?

## 2021-10-01 NOTE — Op Note (Signed)
Preoperative diagnosis: Umbilical Hernia ?  ?Postoperative diagnosis: Umbilical Hernia ?  ?Procedure: Robotic assisted laparoscopic transabdominal pre peritoneal umbilical hernia repair with mesh ? ?Anesthesia: General ?  ?Surgeon: Carolan Shiver, MD, FACS ?  ?Wound Classification: Clean ?  ?Specimen: None ?  ?Complications: None ?  ?Estimated Blood Loss: 5 mL ?  ?Indications: A 41 year old male with symptomatic umbilical hernia. Repair indicated to improve pain and avoid complications such as incarceration or strangulation.  ?  ?Findings: ?3 cm incarcerated umbilical hernia ?  2. Tension free repair achieved with 10 cm x 10 cm Progrip mesh ?  3. Adequate hemostasis ? ? ? ? ? ? ? ? ? ?  ?Description of procedure: The patient was brought to the operating room and general anesthesia was induced. A time-out was completed verifying correct patient, procedure, site, positioning, and implant(s) and/or special equipment prior to beginning this procedure. Antibiotics were administered prior to making the incision. SCDs placed. The anterior abdominal wall was prepped and draped in the standard sterile fashion.  ?  ?Palmer's point chosen for entry.  Veress needle placed and abdomen insufflated to 15cm without any dramatic increase in pressure.  Needle removed and optiview technique used to place 8 mm port at same point.  No injury noted during placement. Two additional ports were placed along left lateral aspect.  Xi robot then docked into place.   ?  ?A pre peritoneal flap was started 6 cm lateral to the umbilical defect. The pre peritoneal flap was extended 6 cm away from the hernia defect in all directions. The Hernia sac and content were reduced.  ?  ?Insufflation dropped to 8 mm and transfacial suture with 0 stratafix used to primarily close defect under minimal tension. Progrip 10 cm x 10 cm  mesh was placed within the the pre peritoneal flap and self attached to the anterior abdominal wall centered over the  defect.  The pre peritoneal flap was closed using 2-0 VLock.   ?  ?Robot was undocked.  Abdomen then desufflated while camera within abdomen to ensure no signs of new bleed prior to removing camera and rest of ports completely.  All skin incisions closed with 4-0 Monocryl in a subcuticular fashion.  All wounds then dressed with Dermabond. ?  ?Patient was then successfully awakened and transferred to PACU in stable condition.  At the end of the procedure sponge and instrument counts were correct. ? ?

## 2021-10-02 ENCOUNTER — Encounter: Payer: Self-pay | Admitting: General Surgery

## 2021-10-08 ENCOUNTER — Encounter: Payer: Self-pay | Admitting: General Surgery

## 2022-04-06 IMAGING — MR MR BRAIN/IAC WO/W CM
11 of 12 series · 41 of 48 positions shown · IV contrast (gadavist)
Comparison: None.

CLINICAL DATA: Dizziness; technologist note states chronic sinus
infections with ear pressure

EXAM:
MRI HEAD WITHOUT AND WITH CONTRAST
TECHNIQUE: Multiplanar, multiecho pulse sequences of the brain and surrounding
structures were obtained without and with intravenous contrast.
CONTRAST:  10mL GADAVIST GADOBUTROL 1 MMOL/ML IV SOLN

[Series 2: DWI · axial · 3.0mm · 1.20mm/px · z∈[+6,+167]mm · 12 of 112 slices shown (1 of 2)]
[im 1/112]
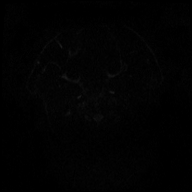
[im 11/112]
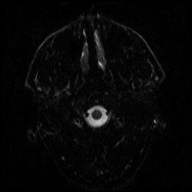
[im 21/112]
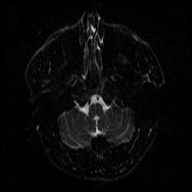
[im 31/112]
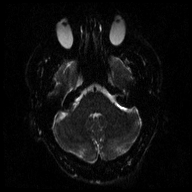
[im 41/112]
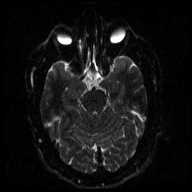
[im 51/112]
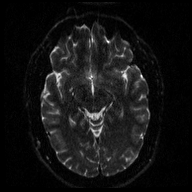
[im 61/112]
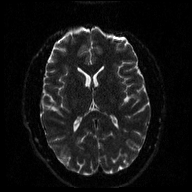
[im 71/112]
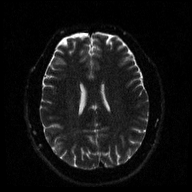
[im 81/112]
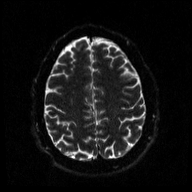
[im 91/112]
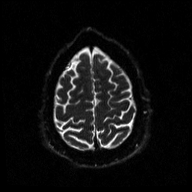
[im 101/112]
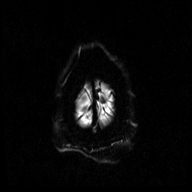
[im 112/112]
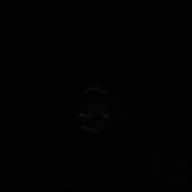

[Series 3: DWI · axial · 3.0mm · 1.20mm/px · z∈[+6,+167]mm · 6 of 57 slices shown (2 of 2)]
[im 1/57]
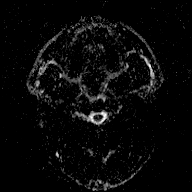
[im 12/57]
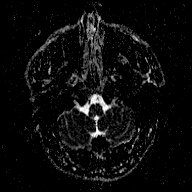
[im 23/57]
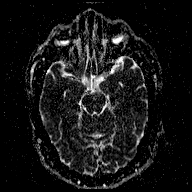
[im 34/57]
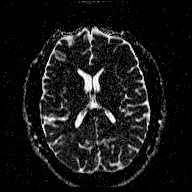
[im 45/57]
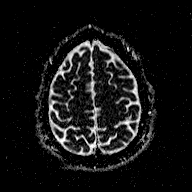
[im 57/57]
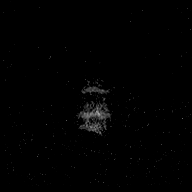

[Series 4: T2 · axial · 5.0mm · 0.72mm/px · z∈[+5,+167]mm · 3 of 27 slices shown (1 of 2)]
[im 1/27]
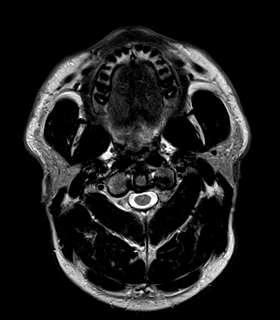
[im 14/27]
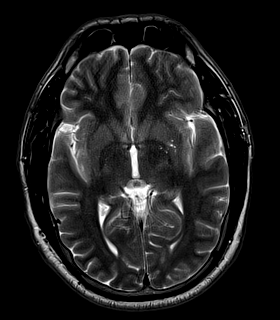
[im 27/27]
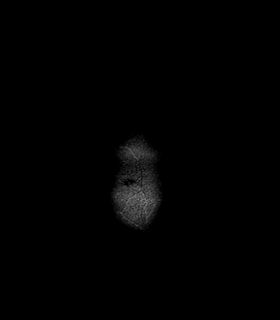

[Series 5: T1 · sagittal · 5.0mm · 0.45mm/px · 3 of 25 slices shown (1 of 3)]
[im 1/25]
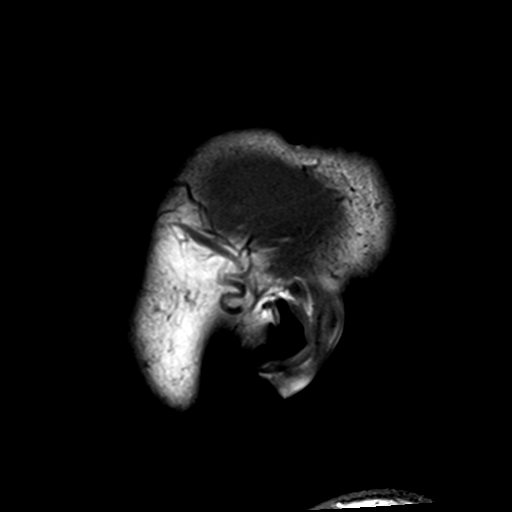
[im 13/25]
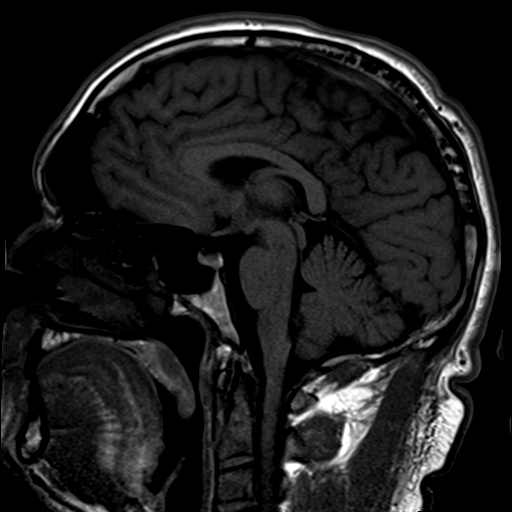
[im 25/25]
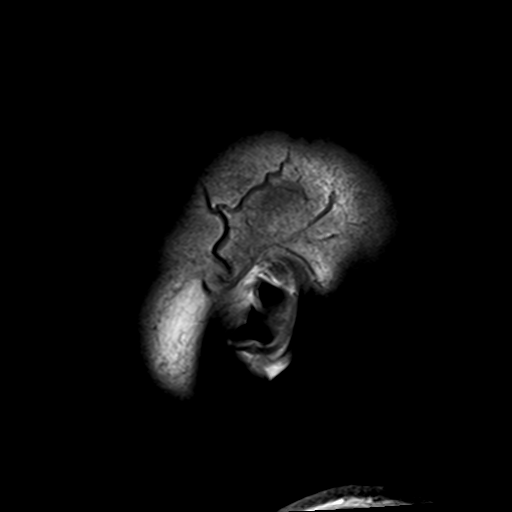

[Series 6: T2 · axial · 5.0mm · 0.72mm/px · z∈[+5,+167]mm · 3 of 27 slices shown (2 of 2)]
[im 1/27]
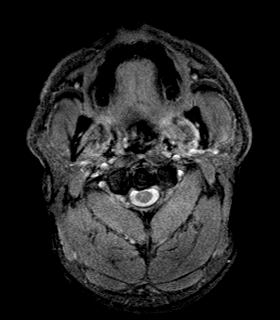
[im 14/27]
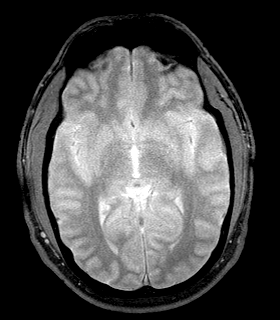
[im 27/27]
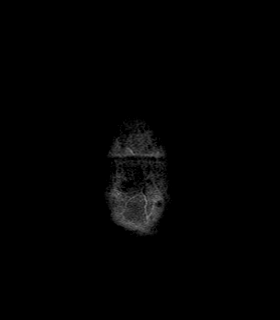

[Series 7: T1 · coronal · 3.0mm · 0.37mm/px · 1 of 11 slices shown (2 of 3)]
[im 1/11]
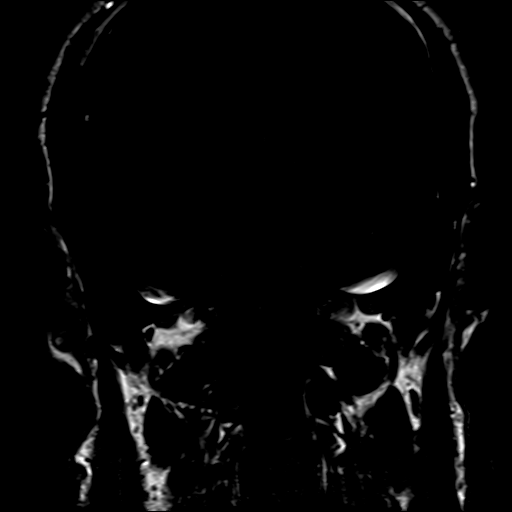

[Series 8: FLAIR · axial · 5.0mm · 0.45mm/px · z∈[+5,+167]mm · 3 of 27 slices shown]
[im 1/27]
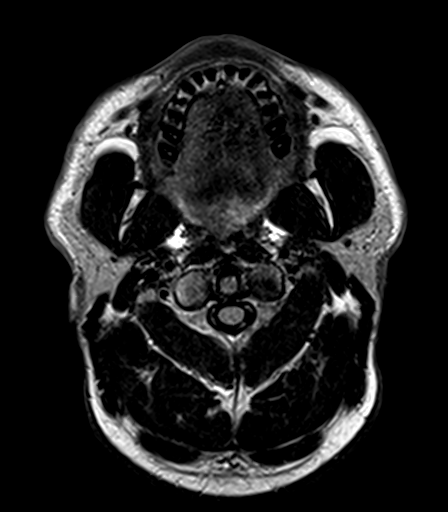
[im 14/27]
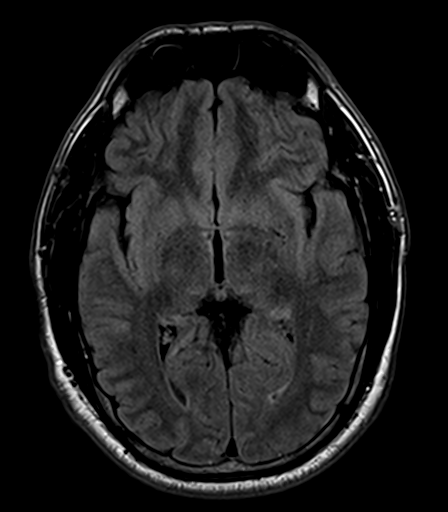
[im 27/27]
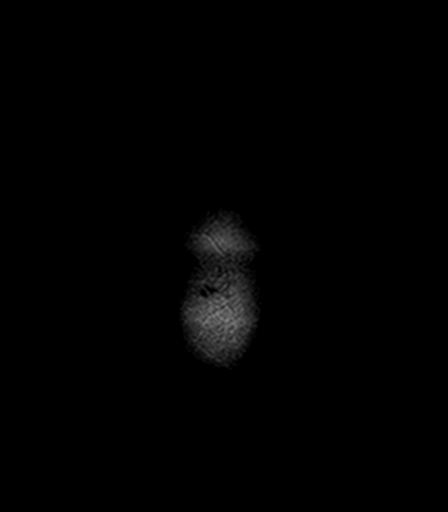

[Series 9: T1 · axial · 3.0mm · 0.37mm/px · 1 of 11 slices shown (3 of 3)]
[im 1/11]
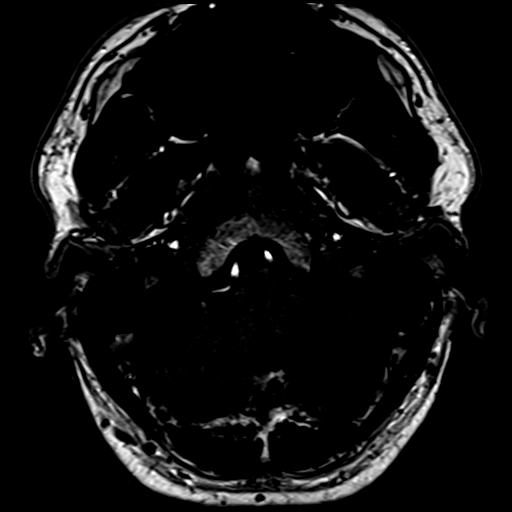

[Series 11: T1 post-contrast · axial · 3.0mm · 0.37mm/px · 1 of 11 slices shown (1 of 3)]
[im 1/11]
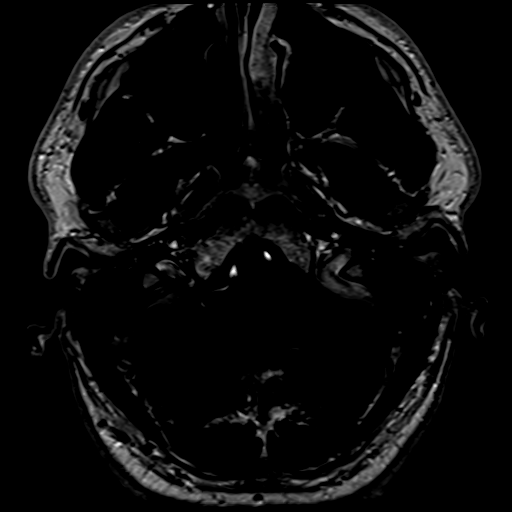

[Series 12: T1 post-contrast · coronal · 3.0mm · 0.37mm/px · 1 of 11 slices shown (2 of 3)]
[im 1/11]
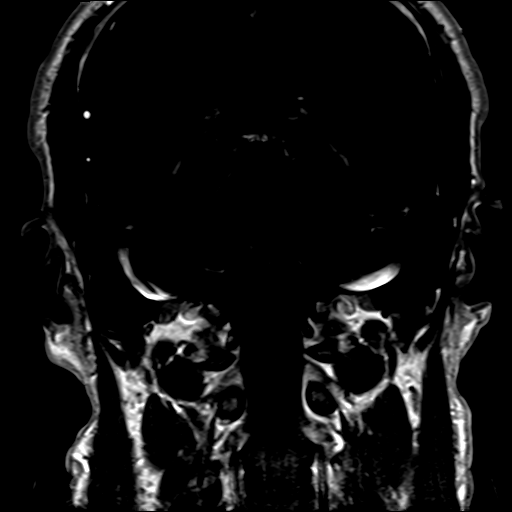

[Series 13: T1 post-contrast · axial · 3.0mm · 1.00mm/px · z∈[+5,+175]mm · 7 of 60 slices shown (3 of 3)]
[im 1/60]
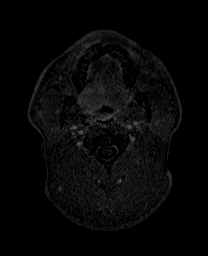
[im 10/60]
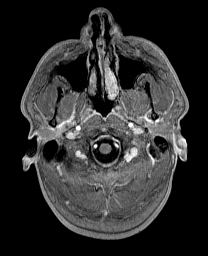
[im 20/60]
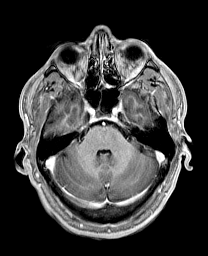
[im 30/60]
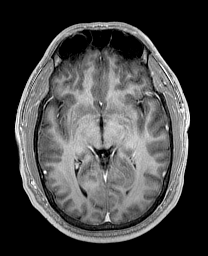
[im 40/60]
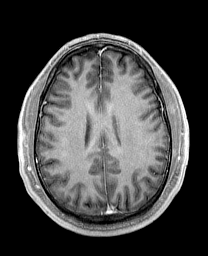
[im 50/60]
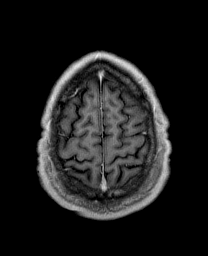
[im 60/60]
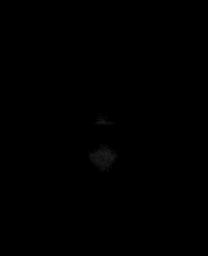

[41 of 48 positions shown; findings below may reference images not displayed]

FINDINGS: Brain: There is no cerebellopontine angle mass. Inner ear structures
demonstrate an unremarkable MR appearance. There is no abnormal
enhancement within the internal auditory canals.

No acute infarction or intracranial hemorrhage. There is no mass
effect or edema. There is no extra-axial fluid collection. No
abnormal enhancement. Ventricles and sulci are normal in size and
configuration.

Vascular: Major vessel flow voids at the skull base are preserved.

Skull and upper cervical spine: Normal marrow signal is preserved.

Sinuses/Orbits: Minor mucosal thickening. The orbits are
unremarkable.

Other: The sella is unremarkable.  Mastoid air cells are clear.
IMPRESSION: No intracranial mass, abnormal enhancement, or other significant
abnormality.
